# Patient Record
Sex: Female | Born: 1985 | Hispanic: No | Marital: Single | Smoking: Former smoker
Health system: Southern US, Community
[De-identification: ages and names within clinical notes are randomized; demographics above are authoritative.]

## PROBLEM LIST (undated history)

## (undated) DIAGNOSIS — F329 Major depressive disorder, single episode, unspecified: Secondary | ICD-10-CM

## (undated) DIAGNOSIS — D649 Anemia, unspecified: Secondary | ICD-10-CM

## (undated) DIAGNOSIS — F32A Depression, unspecified: Secondary | ICD-10-CM

## (undated) DIAGNOSIS — F419 Anxiety disorder, unspecified: Secondary | ICD-10-CM

## (undated) HISTORY — DX: Anemia, unspecified: D64.9

## (undated) HISTORY — DX: Major depressive disorder, single episode, unspecified: F32.9

## (undated) HISTORY — DX: Anxiety disorder, unspecified: F41.9

## (undated) HISTORY — DX: Depression, unspecified: F32.A

---

## 2009-11-04 ENCOUNTER — Emergency Department (HOSPITAL_COMMUNITY): Admission: EM | Admit: 2009-11-04 | Discharge: 2009-11-05 | Payer: Self-pay | Admitting: Emergency Medicine
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
## 2010-11-21 LAB — URINE MICROSCOPIC-ADD ON

## 2010-11-21 LAB — URINALYSIS, ROUTINE W REFLEX MICROSCOPIC
Bilirubin Urine: NEGATIVE
Ketones, ur: NEGATIVE mg/dL
Protein, ur: NEGATIVE mg/dL
pH: 6 (ref 5.0–8.0)

## 2014-11-03 ENCOUNTER — Telehealth: Payer: Self-pay | Admitting: Family Medicine

## 2014-11-03 NOTE — Telephone Encounter (Signed)
Pt given new pt appt with Dr.Stacks 4/19 @ 1:10. Pt aware to arrive 15 minutes prior and needs to have our name on her Medicaid card or we won't be able to see her. Pt isn't on any medications currently.

## 2014-12-15 ENCOUNTER — Ambulatory Visit: Payer: Self-pay | Admitting: Family Medicine

## 2015-05-07 ENCOUNTER — Ambulatory Visit (INDEPENDENT_AMBULATORY_CARE_PROVIDER_SITE_OTHER): Payer: Medicare Other | Admitting: Pediatrics

## 2015-05-07 ENCOUNTER — Encounter (INDEPENDENT_AMBULATORY_CARE_PROVIDER_SITE_OTHER): Payer: Self-pay

## 2015-05-07 ENCOUNTER — Encounter: Payer: Self-pay | Admitting: Pediatrics

## 2015-05-07 VITALS — BP 111/77 | HR 112 | Temp 98.4°F | Ht 67.0 in | Wt 242.0 lb

## 2015-05-07 DIAGNOSIS — R591 Generalized enlarged lymph nodes: Secondary | ICD-10-CM

## 2015-05-07 DIAGNOSIS — R5383 Other fatigue: Secondary | ICD-10-CM | POA: Diagnosis not present

## 2015-05-07 DIAGNOSIS — R599 Enlarged lymph nodes, unspecified: Secondary | ICD-10-CM

## 2015-05-07 NOTE — Progress Notes (Signed)
Subjective:    Patient ID: Susan Dennis, female    DOB: [birth year], 29 y.o.   MRN: [id number]  CC: large lymph node  HPI: Susan Dennis is a 29 y.o. female presenting on 05/07/2015 for eval of lymph node. She is now 7 weeks post-partum, healthy boy Wynona Meals is at home, also has a  27 yo at home, in school. No one at home has been sick recently.   Lymph node at angle of mandible R side has been there for 8 months, doesn't come and go, a little sore when pressed on it. Lots of nasal congestion throughout this time as well. No pain with swallowing. No fevers. Sometimes notices other lymph nodes/bumps come and go L axillary or on neck. No others that have stayed like the oneIs tired much of the time, but is not sure what is from having a new baby at home or if there could be something else going on. Infant still waking up several times at night. Mom is stay at home mom now, dad works M-Thurs.   Weighed 245 lbs before pregnancy, lost to 235 at beginning of pregnancy, gained to 270 at the most while pregnant then has lost 30 lbs since delivering. No itching, easily gets. Appetite has been normal.  No prior hospitalizations.   On prenatal vitamins, no other meds.  Relevant past medical, surgical, family and social history reviewed and updated as indicated. Interim medical history since our last visit reviewed. Allergies and medications reviewed and updated.   ROS: All systems negative other than what was in HPI.     Objective:    BP 111/77 mmHg  Pulse 112  Temp(Src) 98.4 F (36.9 C) (Oral)  Ht 5\' 7"  (1.702 m)  Wt 242 lb (109.77 kg)  BMI 37.89 kg/m2  LMP 05/07/2015 (Exact Date)  Wt Readings from Last 3 Encounters:  05/07/15 242 lb (109.77 kg)     Gen: NAD, alert, cooperative with exam, NCAT EYES: EOMI, no scleral injection or icterus ENT:  TMs pearly gray b/l, OP without erythema LYMPH: 1 cm lymph node at angle of R jaw, easily mobile, non-tender. 2nd pea-sized LN below R ear.  No lymph adenopathy b/l axillae or inguinal areas. CV: NRRR, normal S1/S2, no murmur Resp: CTABL, no wheezes, normal WOB Abd: +BS, soft, NTND. no guarding or organomegaly Ext: No edema, warm Neuro: Alert and oriented, strength equal b/l UE and LE, coordination grossly normal MSK: normal muscle bulk     Assessment & Plan:   Susan Dennis was seen today for fatigue and eval of lymph node. Pt 7 weeks post-partum, has had congestion for much of pregnancy and persistent easily mobile, non-tender apprx 1 cm LN at angle of R jaw. No other lymphadenopathy on exam today. Pt is primary caregiver for 7 week infant and has not been getting large stretches of sleep since infant was born. Discussed dad doing some night time feedings on the nights he is off so that mom can get some more sleep. Putting baby to sleep during day in light room, keeping feedings at night time boring and dark as infant has flipped his nights and days from mom's description.  Will check thyroid and CBC today given persistent fatigue. Mom feels her mood has been ok, she gets more anxious when she is really tired but as soon as she sleeps she feels better. She has not had post-partum depression in the past, feels things are manageable now, just wishes she could get more sleep.  Diagnoses and all orders for this visit:  Enlarged lymph nodes -     CBC with Differential Other fatigue -     Thyroid Panel With TSH -     CBC with Differential   Follow up plan: Return in about 4 weeks (around 06/04/2015), or recheck lymph node.  Rex Kras, MD [facility] 05/07/2015, 4:04 PM

## 2015-05-08 LAB — CBC WITH DIFFERENTIAL/PLATELET
Basophils Absolute: 0 10*3/uL (ref 0.0–0.2)
Basos: 0 %
EOS (ABSOLUTE): 0.5 10*3/uL — ABNORMAL HIGH (ref 0.0–0.4)
Eos: 5 %
Hematocrit: 39.7 % (ref 34.0–46.6)
Hemoglobin: 12 g/dL (ref 11.1–15.9)
Immature Grans (Abs): 0 10*3/uL (ref 0.0–0.1)
Immature Granulocytes: 0 %
Lymphocytes Absolute: 3.9 10*3/uL — ABNORMAL HIGH (ref 0.7–3.1)
Lymphs: 37 %
MCH: 20.9 pg — ABNORMAL LOW (ref 26.6–33.0)
MCHC: 30.2 g/dL — ABNORMAL LOW (ref 31.5–35.7)
MCV: 69 fL — ABNORMAL LOW (ref 79–97)
Monocytes Absolute: 0.5 10*3/uL (ref 0.1–0.9)
Monocytes: 5 %
Neutrophils Absolute: 5.7 10*3/uL (ref 1.4–7.0)
Neutrophils: 53 %
Platelets: 347 10*3/uL (ref 150–379)
RBC: 5.73 x10E6/uL — ABNORMAL HIGH (ref 3.77–5.28)
RDW: 15 % (ref 12.3–15.4)
WBC: 10.6 10*3/uL (ref 3.4–10.8)

## 2015-05-08 LAB — THYROID PANEL WITH TSH
Free Thyroxine Index: 1.7 (ref 1.2–4.9)
T3 Uptake Ratio: 28 % (ref 24–39)
T4, Total: 6.1 ug/dL (ref 4.5–12.0)
TSH: 1.15 u[IU]/mL (ref 0.450–4.500)

## 2015-05-11 ENCOUNTER — Telehealth: Payer: Self-pay | Admitting: Pediatrics

## 2015-05-11 NOTE — Telephone Encounter (Signed)
Patient aware.

## 2015-07-02 ENCOUNTER — Encounter: Payer: Self-pay | Admitting: Pediatrics

## 2015-07-02 ENCOUNTER — Ambulatory Visit (INDEPENDENT_AMBULATORY_CARE_PROVIDER_SITE_OTHER): Payer: Medicare Other | Admitting: Pediatrics

## 2015-07-02 VITALS — BP 111/77 | HR 89 | Temp 97.2°F | Ht 67.0 in | Wt 245.6 lb

## 2015-07-02 DIAGNOSIS — R599 Enlarged lymph nodes, unspecified: Secondary | ICD-10-CM

## 2015-07-02 DIAGNOSIS — R591 Generalized enlarged lymph nodes: Secondary | ICD-10-CM | POA: Diagnosis not present

## 2015-07-02 DIAGNOSIS — L739 Follicular disorder, unspecified: Secondary | ICD-10-CM | POA: Diagnosis not present

## 2015-07-02 MED ORDER — MUPIROCIN 2 % EX OINT: 1.0000 "application " | TOPICAL_OINTMENT | Freq: Two times a day (BID) | CUTANEOUS | Status: AC

## 2015-07-02 NOTE — Progress Notes (Signed)
    Subjective:    Patient ID: Susan Dennis, female    DOB: 09-10-1985, 29 y.o.   MRN: [id number]  CC: lymph node f/u  HPI: Susan FreibergVanity Copus is a 29 y.o. female presenting on 07/02/2015 for Follow-up Still has persistent LN at angle of R mandible Was slightly larger and more tender a couple of weeks ago, now not as tender, lymph node has not gone away for over 10 months Small amount of blood tinged discharge from nose a couple weeks ago, no other URI symptoms. No fevers No sore throat, no cough Fatigue has improved 4 mo infant at home doing well Pt feels supported at home Also sometimes has pimples in groin area. None now.   Relevant past medical, surgical, family and social history reviewed and updated as indicated. Interim medical history since our last visit reviewed. Allergies and medications reviewed and updated.   ROS: Per HPI unless specifically indicated above  Past Medical History There are no active problems to display for this patient.   Current Outpatient Prescriptions  Medication Sig Dispense Refill  . Prenatal Vit-Fe Fumarate-FA (PRENATAL MULTIVITAMIN) TABS tablet Take 1 tablet by mouth daily at 12 noon.    . mupirocin ointment (BACTROBAN) 2 % Place 1 application into the nose 2 (two) times daily. 22 g 0   No current facility-administered medications for this visit.       Objective:    BP 111/77 mmHg  Pulse 89  Temp(Src) 97.2 F (36.2 C) (Oral)  Ht 5\' 7"  (1.702 m)  Wt 245 lb 9.6 oz (111.403 kg)  BMI 38.46 kg/m2  Wt Readings from Last 3 Encounters:  07/02/15 245 lb 9.6 oz (111.403 kg)  05/07/15 242 lb (109.77 kg)     Gen: NAD, alert EYES: EOMI, no scleral injection or icterus ENT:  TMs pearly gray b/l, OP without erythema LYMPH: 1cm hard but mobile lymph node at angle of R mandible. Non-erythematous. CV: NRRR, normal S1/S2, no murmur, distal pulses 2+ b/l Resp: CTABL, no wheezes, normal WOB Abd: +BS, soft, NTND. Neuro: Alert and  oriented Skin: well healed midline c/s scar    Assessment & Plan:   Taela was seen today for follow-up of lymph node. Labs normal at last visit. No URI symptoms. Lymph node persistent for over 10 months, sometimes slightly larger per pt but has not gone away. WIll refer to ENT. No fevers, weight loss, or night sweats. No other lymphadenopathy.  Diagnoses and all orders for this visit:  Enlarged lymph node -     Ambulatory referral to ENT  Folliculitis -     mupirocin ointment (BACTROBAN) 2 %; Place 1 application into the nose 2 (two) times daily.   Follow up plan: Return in about 8 weeks (around 08/27/2015).  Rex Krasarol Vincent, MD [facility] 07/02/2015, 11:46 AM

## 2015-07-16 ENCOUNTER — Telehealth: Payer: Self-pay | Admitting: Pediatrics

## 2015-07-16 DIAGNOSIS — R590 Localized enlarged lymph nodes: Secondary | ICD-10-CM

## 2015-07-20 NOTE — Telephone Encounter (Signed)
Can we change the referral to someone in [place]? [place]. Do you need a new order for that? Thanks, [employer]

## 2015-08-27 ENCOUNTER — Ambulatory Visit: Payer: Medicare Other | Admitting: Pediatrics

## 2015-08-31 ENCOUNTER — Encounter: Payer: Self-pay | Admitting: Pediatrics
